# Patient Record
Sex: Male | Born: 1965 | Race: White | Hispanic: No | Marital: Married | Smoking: Never smoker
Health system: Southern US, Community
[De-identification: ages and names within clinical notes are randomized; demographics above are authoritative.]

## PROBLEM LIST (undated history)

## (undated) DIAGNOSIS — L649 Androgenic alopecia, unspecified: Secondary | ICD-10-CM

## (undated) DIAGNOSIS — E7800 Pure hypercholesterolemia, unspecified: Secondary | ICD-10-CM

## (undated) HISTORY — DX: Androgenic alopecia, unspecified: L64.9

---

## 1999-02-21 ENCOUNTER — Encounter: Payer: Self-pay | Admitting: Family Medicine

## 1999-02-21 ENCOUNTER — Encounter: Admission: RE | Admit: 1999-02-21 | Discharge: 1999-02-21 | Payer: Self-pay | Admitting: Family Medicine

## 2001-02-08 ENCOUNTER — Emergency Department (HOSPITAL_COMMUNITY): Admission: EM | Admit: 2001-02-08 | Discharge: 2001-02-08 | Payer: Self-pay | Admitting: *Deleted

## 2003-01-06 ENCOUNTER — Encounter: Admission: RE | Admit: 2003-01-06 | Discharge: 2003-01-21 | Payer: Self-pay | Admitting: *Deleted

## 2009-04-26 ENCOUNTER — Encounter: Admission: RE | Admit: 2009-04-26 | Discharge: 2009-05-20 | Payer: Self-pay | Admitting: Family Medicine

## 2010-07-17 ENCOUNTER — Emergency Department (HOSPITAL_BASED_OUTPATIENT_CLINIC_OR_DEPARTMENT_OTHER)
Admission: EM | Admit: 2010-07-17 | Discharge: 2010-07-17 | Disposition: A | Discharge status: Home / Self Care | Payer: 59 | Attending: Emergency Medicine | Admitting: Emergency Medicine

## 2010-07-17 ENCOUNTER — Encounter: Payer: Self-pay | Admitting: *Deleted

## 2010-07-17 DIAGNOSIS — T63481A Toxic effect of venom of other arthropod, accidental (unintentional), initial encounter: Secondary | ICD-10-CM

## 2010-07-17 DIAGNOSIS — T6391XA Toxic effect of contact with unspecified venomous animal, accidental (unintentional), initial encounter: Secondary | ICD-10-CM | POA: Insufficient documentation

## 2010-07-17 DIAGNOSIS — T63461A Toxic effect of venom of wasps, accidental (unintentional), initial encounter: Secondary | ICD-10-CM | POA: Insufficient documentation

## 2010-07-17 HISTORY — DX: Pure hypercholesterolemia, unspecified: E78.00

## 2010-07-17 NOTE — ED Provider Notes (Signed)
History     Chief Complaint  Patient presents with  . Insect Bite   The history is provided by the patient.  STUNG ON Saturday 2 DAYS AGO BY MOST LIKELY YELLOW JACKETS TO RIGHT ANKLE, KNEE AND WRIST. NOW WITH LOCALIZED SWELLING AND REDNESS. NO SOB NO HIVES NO RASH NO TONGUE OR LIP SWELLING. PAIN ABOUT 4/10/   Past Medical History  Diagnosis Date  . Hypercholesteremia     History reviewed. No pertinent past surgical history.  History reviewed. No pertinent family history.  History  Substance Use Topics  . Smoking status: Never Smoker   . Smokeless tobacco: Not on file  . Alcohol Use: No      Review of Systems  Constitutional: Negative for fever.  HENT: Negative for neck pain.   Eyes: Negative for redness and itching.  Respiratory: Negative for chest tightness, shortness of breath and wheezing.   Cardiovascular: Negative for chest pain and palpitations.  Gastrointestinal: Negative for nausea, vomiting and abdominal pain.  Genitourinary: Negative for hematuria.  Musculoskeletal: Negative for back pain.  Skin: Negative for rash.  Neurological: Negative for dizziness.  Hematological: Negative for adenopathy.    Physical Exam  BP 118/75  Pulse 78  Temp(Src) 97.4 F (36.3 C) (Oral)  Resp 18  SpO2 100%  Physical Exam  Constitutional: He is oriented to person, place, and time. He appears well-developed and well-nourished.  HENT:  Head: Normocephalic and atraumatic.  Mouth/Throat: Oropharynx is clear and moist.  Eyes: Conjunctivae and EOM are normal. Pupils are equal, round, and reactive to light.  Neck: Normal range of motion. Neck supple.  Cardiovascular: Normal rate, regular rhythm and intact distal pulses.   Murmur heard. Pulmonary/Chest: Effort normal and breath sounds normal. He has no wheezes.  Abdominal: Soft. Bowel sounds are normal.  Musculoskeletal: Normal range of motion. He exhibits tenderness. He exhibits no edema.       REDNESS AND SWELLING TO RIGHT  KNEE ABOUT 4 CM AND SMALL RED BUMPS TO RIGHT ANKLE AND SWELLING TO RIGHT WRIST. [place] DISTALLY INTACT.   Neurological: He is alert and oriented to person, place, and time. No cranial nerve deficit. He exhibits normal muscle tone.  Skin: Skin is warm and dry. No rash noted. There is erythema.    ED Course  Procedures  MDM CE LOCAL REACTION FROM BEE STING NO ALLERGIC REACTION AND NOT CW SECONDARY INFECTION.       Shelda Jakes, MD 07/17/10 [phone]

## 2010-07-17 NOTE — ED Notes (Signed)
Pt was bitten by bee on Saturday. Has sting to right knee, right ankle, right hand. Now c/o increased swelling, redness, and pain. No histopry of bee sting allergies

## 2010-11-07 ENCOUNTER — Encounter (INDEPENDENT_AMBULATORY_CARE_PROVIDER_SITE_OTHER): Payer: Self-pay | Admitting: Surgery

## 2010-11-15 ENCOUNTER — Ambulatory Visit (INDEPENDENT_AMBULATORY_CARE_PROVIDER_SITE_OTHER): Payer: Self-pay | Admitting: General Surgery

## 2010-11-16 ENCOUNTER — Ambulatory Visit (INDEPENDENT_AMBULATORY_CARE_PROVIDER_SITE_OTHER): Payer: 59 | Admitting: Surgery

## 2010-11-16 ENCOUNTER — Encounter (INDEPENDENT_AMBULATORY_CARE_PROVIDER_SITE_OTHER): Payer: Self-pay | Admitting: Surgery

## 2010-11-16 VITALS — BP 116/84 | HR 72 | Temp 98.0°F | Resp 12 | Ht 73.0 in | Wt 195.0 lb

## 2010-11-16 DIAGNOSIS — K645 Perianal venous thrombosis: Secondary | ICD-10-CM | POA: Insufficient documentation

## 2010-11-16 NOTE — Patient Instructions (Signed)
Stay well hydrated Use a daily fiber supplement Use a stool softener as needed for constipation

## 2010-11-16 NOTE — Progress Notes (Signed)
Chief Complaint  Patient presents with  . Other    new pt- eval hems    HPI Gerald Watkins is a 45 y.o. male.  Referred by Dr. Clarene Duke for evaluation of a thrombosed external hemorrhoid. The patient was recently on a trip where his diet changed and he became constipated. He developed a firm mass at the edge of his anus. He denies any pain or bleeding. He was seen by his primary care physician who started him on sitz baths and Anusol ointment. There has been some slight improvement. He continues to be pain-free. His bowel movements have returned to normal. No previous history of problems with hemorrhoids.  HPI  Past Medical History  Diagnosis Date  . Hypercholesteremia   . Hemorrhoids   . Male pattern baldness     History reviewed. No pertinent past surgical history.  Family History  Problem Relation Age of Onset  . Diabetes Mother   . Hypertension Mother   . Hyperlipidemia Father   . Heart disease Father   . Diabetes Sister     Social History History  Substance Use Topics  . Smoking status: Never Smoker   . Smokeless tobacco: Not on file  . Alcohol Use: Yes    No Known Allergies  Current Outpatient Prescriptions  Medication Sig Dispense Refill  . ezetimibe-simvastatin (VYTORIN) 10-40 MG per tablet Take 1 tablet by mouth at bedtime.        . finasteride (PROSCAR) 5 MG tablet Take 5 mg by mouth daily.          Review of Systems Review of Systems ROS negative Blood pressure 116/84, pulse 72, temperature 98 F (36.7 C), temperature source Temporal, resp. rate 12, height 6\' 1"  (1.854 m), weight 195 lb (88.451 kg).  Physical Exam Physical Exam WDWN in NAD HEENT:  EOMI, sclera anicteric Neck:  No masses, no thyromegaly Lungs:  CTA bilaterally; normal respiratory effort CV:  Regular rate and rhythm; no murmurs Abd:  +bowel sounds, soft, non-tender, no masses Rectal:  Small left thrombosed external hemorrhoid; no sign of internal hemorrhoid disease; no abscess of  fistula Ext:  Well-perfused; no edema Skin:  Warm, dry; no sign of jaundice  Data Reviewed .  Assessment    Small resolving thrombosed external hemorrhoid    Plan    Avoid constipation by using fiber supplements and PRN stool softeners.  No surgical treatment needed.       Sammuel Blick K. 11/16/2010, 10:12 AM

## 2011-12-02 ENCOUNTER — Encounter (HOSPITAL_COMMUNITY): Payer: Self-pay | Admitting: *Deleted

## 2011-12-02 ENCOUNTER — Emergency Department (HOSPITAL_COMMUNITY)
Admission: EM | Admit: 2011-12-02 | Discharge: 2011-12-02 | Disposition: A | Discharge status: Home / Self Care | Payer: 59 | Attending: Emergency Medicine | Admitting: Emergency Medicine

## 2011-12-02 DIAGNOSIS — W278XXA Contact with other nonpowered hand tool, initial encounter: Secondary | ICD-10-CM | POA: Insufficient documentation

## 2011-12-02 DIAGNOSIS — S61011A Laceration without foreign body of right thumb without damage to nail, initial encounter: Secondary | ICD-10-CM

## 2011-12-02 DIAGNOSIS — Y9389 Activity, other specified: Secondary | ICD-10-CM | POA: Insufficient documentation

## 2011-12-02 DIAGNOSIS — S61209A Unspecified open wound of unspecified finger without damage to nail, initial encounter: Secondary | ICD-10-CM | POA: Insufficient documentation

## 2011-12-02 DIAGNOSIS — Y929 Unspecified place or not applicable: Secondary | ICD-10-CM | POA: Insufficient documentation

## 2011-12-02 DIAGNOSIS — L659 Nonscarring hair loss, unspecified: Secondary | ICD-10-CM | POA: Insufficient documentation

## 2011-12-02 DIAGNOSIS — Z79899 Other long term (current) drug therapy: Secondary | ICD-10-CM | POA: Insufficient documentation

## 2011-12-02 DIAGNOSIS — Z8719 Personal history of other diseases of the digestive system: Secondary | ICD-10-CM | POA: Insufficient documentation

## 2011-12-02 DIAGNOSIS — E7800 Pure hypercholesterolemia, unspecified: Secondary | ICD-10-CM | POA: Insufficient documentation

## 2011-12-02 NOTE — Progress Notes (Signed)
Orthopedic Tech Progress Note Patient Details:  Gerald Watkins [birth year] [id number]  Ortho Devices Type of Ortho Device: Finger splint Ortho Device/Splint Location: (R) UE Ortho Device/Splint Interventions: Application   Jennye Moccasin 12/02/2011, 10:13 PM

## 2011-12-02 NOTE — ED Notes (Signed)
Ortho paged for finger splint   

## 2011-12-02 NOTE — ED Provider Notes (Signed)
Medical screening examination/treatment/procedure(s) were performed by non-physician practitioner and as supervising physician I was immediately available for consultation/collaboration.  John-Adam Gadiel John, M.D.     John-Adam Viviene Thurston, MD 12/02/11 2343 

## 2011-12-02 NOTE — ED Notes (Signed)
Reports cutting right thumb with chainsaw blade. appro 2 inch laceration noted, bleeding controlled.

## 2011-12-02 NOTE — ED Provider Notes (Signed)
History     CSN: [id number]  Arrival date & time 12/02/11  1755   First MD Initiated Contact with Patient 12/02/11 2006      Chief Complaint  Patient presents with  . Laceration    (Consider location/radiation/quality/duration/timing/severity/associated sxs/prior treatment) HPI History provided by pt.   Pt was cutting down a christmas tree with a chain saw this afternoon and accidentally touched the palmar surface of his right thumb to the blade. Bleeding controlled and pain 1/10.  No associated paresthesias.  Last tetanus 1 month ago.  Past Medical History  Diagnosis Date  . Hypercholesteremia   . Hemorrhoids   . Male pattern baldness     History reviewed. No pertinent past surgical history.  Family History  Problem Relation Age of Onset  . Diabetes Mother   . Hypertension Mother   . Hyperlipidemia Father   . Heart disease Father   . Diabetes Sister     History  Substance Use Topics  . Smoking status: Never Smoker   . Smokeless tobacco: Not on file  . Alcohol Use: Yes      Review of Systems  All other systems reviewed and are negative.    Allergies  Thimerosal  Home Medications   Current Outpatient Rx  Name  Route  Sig  Dispense  Refill  . EZETIMIBE-SIMVASTATIN 10-40 MG PO TABS   Oral   Take 1 tablet by mouth at bedtime.           Marland Kitchen FINASTERIDE 5 MG PO TABS   Oral   Take 1 mg by mouth daily.           BP 144/90  Pulse 65  Temp 98.2 F (36.8 C) (Oral)  Resp 20  SpO2 98%  Physical Exam  Nursing note and vitals reviewed. Constitutional: He is oriented to person, place, and time. He appears well-developed and well-nourished. No distress.  HENT:  Head: Normocephalic and atraumatic.  Eyes:       Normal appearance  Neck: Normal range of motion.  Pulmonary/Chest: Effort normal.  Musculoskeletal: Normal range of motion.  Neurological: He is alert and oriented to person, place, and time.  Skin:       2cm subq hemostatic lac that runs  vertically down palmar surface of proximal phalanx.  Minimally tender.  No pain w/ active flexion of proximal and distal joints. Distal sensation intact.    Psychiatric: He has a normal mood and affect. His behavior is normal.    ED Course  Procedures (including critical care time)  LACERATION REPAIR Performed by: Otilio Miu Authorized by: Ruby Cola E Consent: Verbal consent obtained. Risks and benefits: risks, benefits and alternatives were discussed Consent given by: patient Patient identity confirmed: provided demographic data Prepped and Draped in normal sterile fashion Wound explored  Laceration Location: R thumb   Laceration Length: 2.0cm  No Foreign Bodies seen or palpated  Anesthesia: local infiltration  Digital block and Local anesthetic: lidocaine 2% w/out epinephrine  Anesthetic total: 8 ml total  Irrigation method: Nursing staff performed   Skin closure: prolen 4.0  Number of sutures: 5  Technique: simple interrupted  Patient tolerance: Patient tolerated the procedure well with no immediate complications.  Labs Reviewed - No data to display No results found.   1. Laceration of right thumb       MDM  46yo M presents w/ lac to right thumb.  Wound cleaned by nursing staff and sutured by myself.  Ortho tech placed in  finger splint. Tetanus is up to date.  D/c' d home w/ return precautions.         Otilio Miu, PA-C 12/02/11 2150

## 2013-05-18 ENCOUNTER — Encounter (HOSPITAL_BASED_OUTPATIENT_CLINIC_OR_DEPARTMENT_OTHER): Payer: Self-pay | Admitting: Emergency Medicine

## 2013-05-18 ENCOUNTER — Emergency Department (HOSPITAL_BASED_OUTPATIENT_CLINIC_OR_DEPARTMENT_OTHER): Payer: Managed Care, Other (non HMO)

## 2013-05-18 ENCOUNTER — Emergency Department (HOSPITAL_BASED_OUTPATIENT_CLINIC_OR_DEPARTMENT_OTHER)
Admission: EM | Admit: 2013-05-18 | Discharge: 2013-05-18 | Disposition: A | Discharge status: Home / Self Care | Payer: Managed Care, Other (non HMO) | Attending: Emergency Medicine | Admitting: Emergency Medicine

## 2013-05-18 DIAGNOSIS — Y92009 Unspecified place in unspecified non-institutional (private) residence as the place of occurrence of the external cause: Secondary | ICD-10-CM | POA: Insufficient documentation

## 2013-05-18 DIAGNOSIS — Y9389 Activity, other specified: Secondary | ICD-10-CM | POA: Insufficient documentation

## 2013-05-18 DIAGNOSIS — Z872 Personal history of diseases of the skin and subcutaneous tissue: Secondary | ICD-10-CM | POA: Insufficient documentation

## 2013-05-18 DIAGNOSIS — R296 Repeated falls: Secondary | ICD-10-CM | POA: Insufficient documentation

## 2013-05-18 DIAGNOSIS — X500XXA Overexertion from strenuous movement or load, initial encounter: Secondary | ICD-10-CM | POA: Insufficient documentation

## 2013-05-18 DIAGNOSIS — E7800 Pure hypercholesterolemia, unspecified: Secondary | ICD-10-CM | POA: Insufficient documentation

## 2013-05-18 DIAGNOSIS — Z91040 Latex allergy status: Secondary | ICD-10-CM | POA: Insufficient documentation

## 2013-05-18 DIAGNOSIS — S93401A Sprain of unspecified ligament of right ankle, initial encounter: Secondary | ICD-10-CM

## 2013-05-18 DIAGNOSIS — Z8679 Personal history of other diseases of the circulatory system: Secondary | ICD-10-CM | POA: Insufficient documentation

## 2013-05-18 DIAGNOSIS — S93409A Sprain of unspecified ligament of unspecified ankle, initial encounter: Secondary | ICD-10-CM | POA: Insufficient documentation

## 2013-05-18 DIAGNOSIS — Z79899 Other long term (current) drug therapy: Secondary | ICD-10-CM | POA: Insufficient documentation

## 2013-05-18 NOTE — ED Notes (Signed)
Pt returned from xr

## 2013-05-18 NOTE — ED Notes (Signed)
Pt c/o having rolled his right ankle yesterday. Same is now swollen and painful. Pt sts he felt pop.

## 2013-05-18 NOTE — ED Notes (Signed)
Patient transported to X-ray 

## 2013-05-18 NOTE — Discharge Instructions (Signed)

## 2013-05-18 NOTE — ED Provider Notes (Signed)
CSN: [id number]     Arrival date & time 05/18/13  0704 History   First MD Initiated Contact with Patient 05/18/13 [phone]     Chief Complaint  Patient presents with  . Ankle Pain     (Consider location/radiation/quality/duration/timing/severity/associated sxs/prior Treatment) Patient is a 48 y.o. male presenting with ankle pain.  Ankle Pain  Pt reports he stumbled and fell in a hole in his yard yesterday twisting his R ankle. He heard a pop, complaining of moderate aching pain to lateral ankle, worse with movement, but able to bear weight with some discomfort. Denies any other injuries,.   Past Medical History  Diagnosis Date  . Hypercholesteremia   . Hemorrhoids   . Male pattern baldness    History reviewed. No pertinent past surgical history. Family History  Problem Relation Age of Onset  . Diabetes Mother   . Hypertension Mother   . Hyperlipidemia Father   . Heart disease Father   . Diabetes Sister    History  Substance Use Topics  . Smoking status: Never Smoker   . Smokeless tobacco: Not on file  . Alcohol Use: Yes    Review of Systems All other systems reviewed and are negative except as noted in HPI.     Allergies  Thimerosal and Latex  Home Medications   Prior to Admission medications   Medication Sig Start Date End Date Taking? Authorizing Provider  atorvastatin (LIPITOR) 10 MG tablet Take 10 mg by mouth daily.   Yes Historical Provider, MD  ezetimibe-simvastatin (VYTORIN) 10-40 MG per tablet Take 1 tablet by mouth at bedtime.      Historical Provider, MD  finasteride (PROSCAR) 5 MG tablet Take 1 mg by mouth daily.    Historical Provider, MD   BP 122/76  Pulse 74  Temp(Src) 98.1 F (36.7 C) (Oral)  Resp 16  SpO2 100% Physical Exam  Constitutional: He is oriented to person, place, and time. He appears well-developed and well-nourished.  HENT:  Head: Normocephalic and atraumatic.  Eyes: EOM are normal.  Neck: Neck supple.  Pulmonary/Chest: Effort  normal.  Musculoskeletal: He exhibits tenderness (R lateral maleolus with moderate swelling, no deformity, NVI).  Neurological: He is alert and oriented to person, place, and time. No cranial nerve deficit.  Psychiatric: He has a normal mood and affect. His behavior is normal.    ED Course  Procedures (including critical care time) Labs Review Labs Reviewed - No data to display  Imaging Review Dg Ankle Complete Right  05/18/2013   CLINICAL DATA:  Pain and swelling post trauma  EXAM: RIGHT ANKLE - COMPLETE 3+ VIEW  COMPARISON:  None.  FINDINGS: Frontal, oblique, and lateral views were obtained. There is no fracture or appreciable effusion. The ankle mortise appears intact. There is mild narrowing medially. No erosive change.  IMPRESSION: Osteoarthritic change medially. No apparent fracture. Ankle mortise appears grossly intact.   Electronically Signed   By: Bretta BangWilliam  Woodruff M.D.   On: 05/18/2013 07:32     EKG Interpretation None      MDM   Final diagnoses:  Sprain of ankle, right    ASO, RICE, Motrin/APAP for pain.     Charles B. Bernette MayersSheldon, MD 05/18/13 [phone]

## 2014-05-10 ENCOUNTER — Other Ambulatory Visit: Payer: Self-pay | Admitting: Cardiology

## 2014-05-10 DIAGNOSIS — E785 Hyperlipidemia, unspecified: Secondary | ICD-10-CM

## 2014-05-12 ENCOUNTER — Ambulatory Visit
Admission: RE | Admit: 2014-05-12 | Discharge: 2014-05-12 | Disposition: A | Discharge status: Home / Self Care | Payer: No Typology Code available for payment source | Source: Ambulatory Visit | Attending: Cardiology | Admitting: Cardiology

## 2014-05-12 DIAGNOSIS — E785 Hyperlipidemia, unspecified: Secondary | ICD-10-CM

## 2014-05-14 ENCOUNTER — Other Ambulatory Visit: Payer: Managed Care, Other (non HMO)

## 2018-12-23 ENCOUNTER — Other Ambulatory Visit: Payer: Self-pay

## 2018-12-23 ENCOUNTER — Ambulatory Visit: Payer: Managed Care, Other (non HMO) | Attending: Family Medicine

## 2018-12-23 DIAGNOSIS — Z20822 Contact with and (suspected) exposure to covid-19: Secondary | ICD-10-CM

## 2018-12-23 DIAGNOSIS — Z20828 Contact with and (suspected) exposure to other viral communicable diseases: Secondary | ICD-10-CM | POA: Insufficient documentation

## 2018-12-24 LAB — NOVEL CORONAVIRUS, NAA: SARS-CoV-2, NAA: NOT DETECTED

## 2020-11-28 ENCOUNTER — Other Ambulatory Visit: Payer: Self-pay

## 2020-11-28 ENCOUNTER — Emergency Department (HOSPITAL_BASED_OUTPATIENT_CLINIC_OR_DEPARTMENT_OTHER)
Admission: EM | Admit: 2020-11-28 | Discharge: 2020-11-28 | Disposition: A | Discharge status: Home / Self Care | Payer: Managed Care, Other (non HMO) | Attending: Emergency Medicine | Admitting: Emergency Medicine

## 2020-11-28 ENCOUNTER — Emergency Department (HOSPITAL_BASED_OUTPATIENT_CLINIC_OR_DEPARTMENT_OTHER): Payer: Managed Care, Other (non HMO)

## 2020-11-28 ENCOUNTER — Encounter (HOSPITAL_BASED_OUTPATIENT_CLINIC_OR_DEPARTMENT_OTHER): Payer: Self-pay | Admitting: Emergency Medicine

## 2020-11-28 DIAGNOSIS — R112 Nausea with vomiting, unspecified: Secondary | ICD-10-CM | POA: Diagnosis not present

## 2020-11-28 DIAGNOSIS — R070 Pain in throat: Secondary | ICD-10-CM | POA: Diagnosis present

## 2020-11-28 DIAGNOSIS — J029 Acute pharyngitis, unspecified: Secondary | ICD-10-CM | POA: Insufficient documentation

## 2020-11-28 DIAGNOSIS — R109 Unspecified abdominal pain: Secondary | ICD-10-CM | POA: Insufficient documentation

## 2020-11-28 DIAGNOSIS — Z91040 Latex allergy status: Secondary | ICD-10-CM | POA: Diagnosis not present

## 2020-11-28 DIAGNOSIS — M542 Cervicalgia: Secondary | ICD-10-CM | POA: Insufficient documentation

## 2020-11-28 LAB — CBC
HCT: 35.9 % — ABNORMAL LOW (ref 39.0–52.0)
Hemoglobin: 12.7 g/dL — ABNORMAL LOW (ref 13.0–17.0)
MCH: 31.8 pg (ref 26.0–34.0)
MCHC: 35.4 g/dL (ref 30.0–36.0)
MCV: 90 fL (ref 80.0–100.0)
Platelets: 282 10*3/uL (ref 150–400)
RBC: 3.99 MIL/uL — ABNORMAL LOW (ref 4.22–5.81)
RDW: 12.1 % (ref 11.5–15.5)
WBC: 7.7 10*3/uL (ref 4.0–10.5)
nRBC: 0 % (ref 0.0–0.2)

## 2020-11-28 LAB — BASIC METABOLIC PANEL
Anion gap: 8 (ref 5–15)
BUN: 13 mg/dL (ref 6–20)
CO2: 23 mmol/L (ref 22–32)
Calcium: 8.4 mg/dL — ABNORMAL LOW (ref 8.9–10.3)
Chloride: 105 mmol/L (ref 98–111)
Creatinine, Ser: 0.85 mg/dL (ref 0.61–1.24)
GFR, Estimated: >60 mL/min (ref >60)
Glucose, Bld: 97 mg/dL (ref 70–99)
Potassium: 3.7 mmol/L (ref 3.5–5.1)
Sodium: 136 mmol/L (ref 135–145)

## 2020-11-28 LAB — TROPONIN I (HIGH SENSITIVITY): Troponin I (High Sensitivity): 2 ng/L (ref <18)

## 2020-11-28 MED ORDER — LACTATED RINGERS IV BOLUS
1000.0000 mL | Freq: Once | INTRAVENOUS | Status: AC
  Administered 2020-11-28: 09:00:00 1000 mL via INTRAVENOUS

## 2020-11-28 MED ORDER — IOHEXOL 300 MG/ML  SOLN
100.0000 mL | Freq: Once | INTRAMUSCULAR | Status: AC | PRN
  Administered 2020-11-28: 09:00:00 75 mL via INTRAVENOUS

## 2020-11-28 MED ORDER — KETOROLAC TROMETHAMINE 15 MG/ML IJ SOLN
15.0000 mg | Freq: Once | INTRAMUSCULAR | Status: AC
  Administered 2020-11-28: 11:00:00 15 mg via INTRAVENOUS
  Filled 2020-11-28 (×2): qty 1

## 2020-11-28 NOTE — ED Notes (Signed)
Pt transported to CT ?

## 2020-11-28 NOTE — ED Triage Notes (Signed)
Pt reports he had abd pain last night and began having emesis. After throwing up pt complains of chest and throat pain. Says he can swallow saliva but that is it.

## 2020-11-28 NOTE — ED Notes (Signed)
Pt does not want toradol at present time

## 2020-11-28 NOTE — ED Provider Notes (Signed)
[facility] EMERGENCY DEPARTMENT Provider Note   CSN: [id number] Arrival date & time: 11/28/20  0741     History Chief Complaint  Patient presents with   Chest Pain   Sore Throat    ZAKARIYA OHERRON is a 55 y.o. male.   Chest Pain Associated symptoms: dysphagia, nausea and vomiting   Associated symptoms: no abdominal pain, no back pain, no cough, no diaphoresis, no dizziness, no fatigue, no fever, no headache, no numbness, no palpitations, no shortness of breath and no weakness   Sore Throat Associated symptoms include chest pain. Pertinent negatives include no abdominal pain, no headaches and no shortness of breath.  Patient presents for sore throat and globus sensation.  Yesterday, going to bed, patient was in his normal state of health.  At approximately 1:30 AM, he woke up with severe epigastric pain.  He had multiple episodes of what he describes as violent vomiting.  Since that time, he has had globus sensation in the midportion of his neck, pain, and odynophagia.  Odynophagia so severe that patient has been unable to swallow.  He has been spitting even his saliva secretions.  He denies any history of the same.  Currently, pain is localized to the midportion of his neck.  He denies any chest pain, pleurisy, or shortness of breath.  He is in good health and has no known chronic medical conditions.  He is quite active.  Currently, he denies any nausea or residual epigastric pain.  The episode of abdominal pain and vomiting is a rare thing for him.  He typically does not experience symptoms like this.  He is unaware of any suspicious food that he ate yesterday.      Past Medical History:  Diagnosis Date   Hemorrhoids    Hypercholesteremia    Male pattern baldness     Patient Active Problem List   Diagnosis Date Noted   Thrombosed external hemorrhoid 11/16/2010    History reviewed. No pertinent surgical history.     Family History  Problem Relation Age of Onset    Diabetes Mother    Hypertension Mother    Hyperlipidemia Father    Heart disease Father    Diabetes Sister     Social History   Tobacco Use   Smoking status: Never  Substance Use Topics   Alcohol use: Yes   Drug use: No    Home Medications Prior to Admission medications   Medication Sig Start Date End Date Taking? Authorizing Provider  atorvastatin (LIPITOR) 10 MG tablet Take 10 mg by mouth daily.    [provider]  ezetimibe-simvastatin (VYTORIN) 10-40 MG per tablet Take 1 tablet by mouth at bedtime.      [provider]  finasteride (PROSCAR) 5 MG tablet Take 1 mg by mouth daily.    [provider]    Allergies    Thimerosal and Latex  Review of Systems   Review of Systems  Constitutional:  Negative for chills, diaphoresis, fatigue and fever.  HENT:  Positive for sore throat and trouble swallowing. Negative for congestion, ear pain and rhinorrhea.   Eyes:  Negative for pain and visual disturbance.  Respiratory:  Negative for cough, chest tightness and shortness of breath.   Cardiovascular:  Positive for chest pain. Negative for palpitations and leg swelling.  Gastrointestinal:  Positive for nausea and vomiting. Negative for abdominal distention, abdominal pain and diarrhea.  Genitourinary:  Negative for dysuria, flank pain and hematuria.  Musculoskeletal:  Negative for  arthralgias, back pain, gait problem, joint swelling, myalgias, neck pain and neck stiffness.  Skin:  Negative for color change and rash.  Neurological:  Negative for dizziness, seizures, syncope, weakness, light-headedness, numbness and headaches.  Psychiatric/Behavioral:  Negative for confusion and decreased concentration.   All other systems reviewed and are negative.  Physical Exam Updated Vital Signs BP (!) 132/112   Pulse 68   Temp 97.8 F (36.6 C) (Oral)   Resp 16   Ht 6\' 1"  (1.854 m)   Wt 92.5 kg   SpO2 100%   BMI 26.91 kg/m   Physical Exam Vitals and nursing  note reviewed.  Constitutional:      General: He is not in acute distress.    Appearance: He is well-developed and normal weight. He is not ill-appearing, toxic-appearing or diaphoretic.  HENT:     Head: Normocephalic and atraumatic.     Nose: No congestion or rhinorrhea.     Mouth/Throat:     Mouth: Mucous membranes are moist. No oral lesions.     Pharynx: Oropharynx is clear. Uvula midline. Uvula swelling present. No oropharyngeal exudate or posterior oropharyngeal erythema.  Eyes:     Extraocular Movements: Extraocular movements intact.     Conjunctiva/sclera: Conjunctivae normal.  Neck:     Vascular: No JVD.  Cardiovascular:     Rate and Rhythm: Normal rate and regular rhythm.     Heart sounds: No murmur heard. Pulmonary:     Effort: Pulmonary effort is normal. No tachypnea or respiratory distress.     Breath sounds: Normal breath sounds. No decreased breath sounds, wheezing, rhonchi or rales.  Chest:     Chest wall: No mass or tenderness.  Abdominal:     Palpations: Abdomen is soft.     Tenderness: There is no abdominal tenderness.  Musculoskeletal:        General: No swelling.     Cervical back: Normal range of motion and neck supple.     Right lower leg: No edema.     Left lower leg: No edema.  Skin:    General: Skin is warm and dry.     Capillary Refill: Capillary refill takes less than 2 seconds.  Neurological:     General: No focal deficit present.     Mental Status: He is alert and oriented to person, place, and time.     Cranial Nerves: No cranial nerve deficit.     Motor: No weakness.  Psychiatric:        Mood and Affect: Mood normal.        Behavior: Behavior normal.    ED Results / Procedures / Treatments   Labs (all labs ordered are listed, but only abnormal results are displayed) Labs Reviewed  BASIC METABOLIC PANEL - Abnormal; Notable for the following components:      Result Value   Calcium 8.4 (*)    All other components within normal limits   CBC - Abnormal; Notable for the following components:   RBC 3.99 (*)    Hemoglobin 12.7 (*)    HCT 35.9 (*)    All other components within normal limits  TROPONIN I (HIGH SENSITIVITY)    EKG EKG Interpretation  Date/Time:  Monday November 28 2020 07:52:50 EST Ventricular Rate:  78 PR Interval:  159 QRS Duration: 98 QT Interval:  390 QTC Calculation: 445 R Axis:   73 Text Interpretation: Sinus rhythm Confirmed by Godfrey Pick ([id number]) on 11/28/2020 7:57:15 AM  Radiology DG Chest 2 View  Result Date: 11/28/2020 CLINICAL DATA:  Chest and abdominal pain Emesis EXAM: CHEST - 2 VIEW COMPARISON:  None. FINDINGS: The heart size and mediastinal contours are within normal limits. Both lungs are clear. The visualized skeletal structures are unremarkable. IMPRESSION: No active cardiopulmonary disease. Electronically Signed   By: Acquanetta Belling M.D.   On: 11/28/2020 08:09   CT Soft Tissue Neck W Contrast  Result Date: 11/28/2020 CLINICAL DATA:  Esophageal mass; technologist note states pain after throwing up EXAM: CT NECK WITH CONTRAST TECHNIQUE: Multidetector CT imaging of the neck was performed using the standard protocol following the bolus administration of intravenous contrast. CONTRAST:  38mL OMNIPAQUE IOHEXOL 300 MG/ML  SOLN COMPARISON:  None. FINDINGS: Pharynx and larynx: Unremarkable. No mass or swelling. Vocal cords opposed at the time of imaging. Airway is patent. Salivary glands: Parotid and submandibular glands are unremarkable. Thyroid: Normal. Lymph nodes: No enlarged or abnormal density nodes. Vascular: Major neck vessels are patent. Limited intracranial: No abnormal enhancement. Visualized orbits: Unremarkable. Mastoids and visualized paranasal sinuses: Paranasal sinus mucosal thickening. Mastoid air cells are clear. Skeleton: Minor degenerative changes at C5-C6. Upper chest: No apical lung mass. Other: None. IMPRESSION: No acute or significant abnormality. Electronically Signed   By:  Guadlupe Spanish M.D.   On: 11/28/2020 08:50    Procedures Procedures   Medications Ordered in ED Medications  lactated ringers bolus 1,000 mL (0 mLs Intravenous Stopped 11/28/20 1044)  ketorolac (TORADOL) 15 MG/ML injection 15 mg (15 mg Intravenous Given 11/28/20 1044)  iohexol (OMNIPAQUE) 300 MG/ML solution 100 mL (75 mLs Intravenous Contrast Given 11/28/20 0831)    ED Course  I have reviewed the triage vital signs and the nursing notes.  Pertinent labs & imaging results that were available during my care of the patient were reviewed by me and considered in my medical decision making (see chart for details).    MDM Rules/Calculators/A&P                          Patient presents for globus sensation and odynophagia following an episode of violent vomiting last night.  On arrival, vital signs are normal.  He is well-appearing.  EKG shows no sign of ischemia.  Chest x-ray was obtained which did not show any clear evidence of pneumomediastinum.  Patient undergo lab work and CT scan of neck.  Given his p.o. intolerance and vomiting, IV fluids were ordered.  Toradol was ordered for analgesia.  Patient initially declined the Toradol.  CT scan showed no acute findings.  He was informed of these results.  At this time, he is able to tolerate swallowing.  He was given ice cream, which he was able to tolerate.  He was agreeable to dose of Toradol for analgesia.  Following this, he had further improvement in his symptoms.  He is stable for discharge at this time.  He was advised to return to the ED if he does have any worsening of his symptoms.  Additionally, contact information was given for ENT follow-up, as needed.  Patient was discharged in good condition.  Final Clinical Impression(s) / ED Diagnoses Final diagnoses:  Sore throat    Rx / DC Orders ED Discharge Orders     None        Gloris Manchester, MD 11/29/20 [phone]

## 2021-10-06 ENCOUNTER — Other Ambulatory Visit: Payer: Self-pay | Admitting: Family Medicine

## 2021-10-06 DIAGNOSIS — R7989 Other specified abnormal findings of blood chemistry: Secondary | ICD-10-CM

## 2021-10-11 ENCOUNTER — Other Ambulatory Visit: Payer: Self-pay | Admitting: Family Medicine

## 2021-10-11 DIAGNOSIS — E7800 Pure hypercholesterolemia, unspecified: Secondary | ICD-10-CM

## 2021-10-16 ENCOUNTER — Other Ambulatory Visit: Payer: No Typology Code available for payment source

## 2021-10-17 ENCOUNTER — Ambulatory Visit
Admission: RE | Admit: 2021-10-17 | Discharge: 2021-10-17 | Disposition: A | Discharge status: Home / Self Care | Payer: Managed Care, Other (non HMO) | Source: Ambulatory Visit | Attending: Family Medicine | Admitting: Family Medicine

## 2021-10-17 DIAGNOSIS — R7989 Other specified abnormal findings of blood chemistry: Secondary | ICD-10-CM

## 2021-10-23 ENCOUNTER — Other Ambulatory Visit: Payer: No Typology Code available for payment source

## 2021-10-31 ENCOUNTER — Ambulatory Visit
Admission: RE | Admit: 2021-10-31 | Discharge: 2021-10-31 | Disposition: A | Discharge status: Home / Self Care | Payer: No Typology Code available for payment source | Source: Ambulatory Visit | Attending: Family Medicine | Admitting: Family Medicine

## 2021-10-31 DIAGNOSIS — E7800 Pure hypercholesterolemia, unspecified: Secondary | ICD-10-CM

## 2022-11-10 IMAGING — CR DG CHEST 2V
2 series · 2 of 2 positions shown · non-contrast
Comparison: None.

CLINICAL DATA: Chest and abdominal pain

Emesis
EXAM:
CHEST - 2 VIEW

[w chest pa]
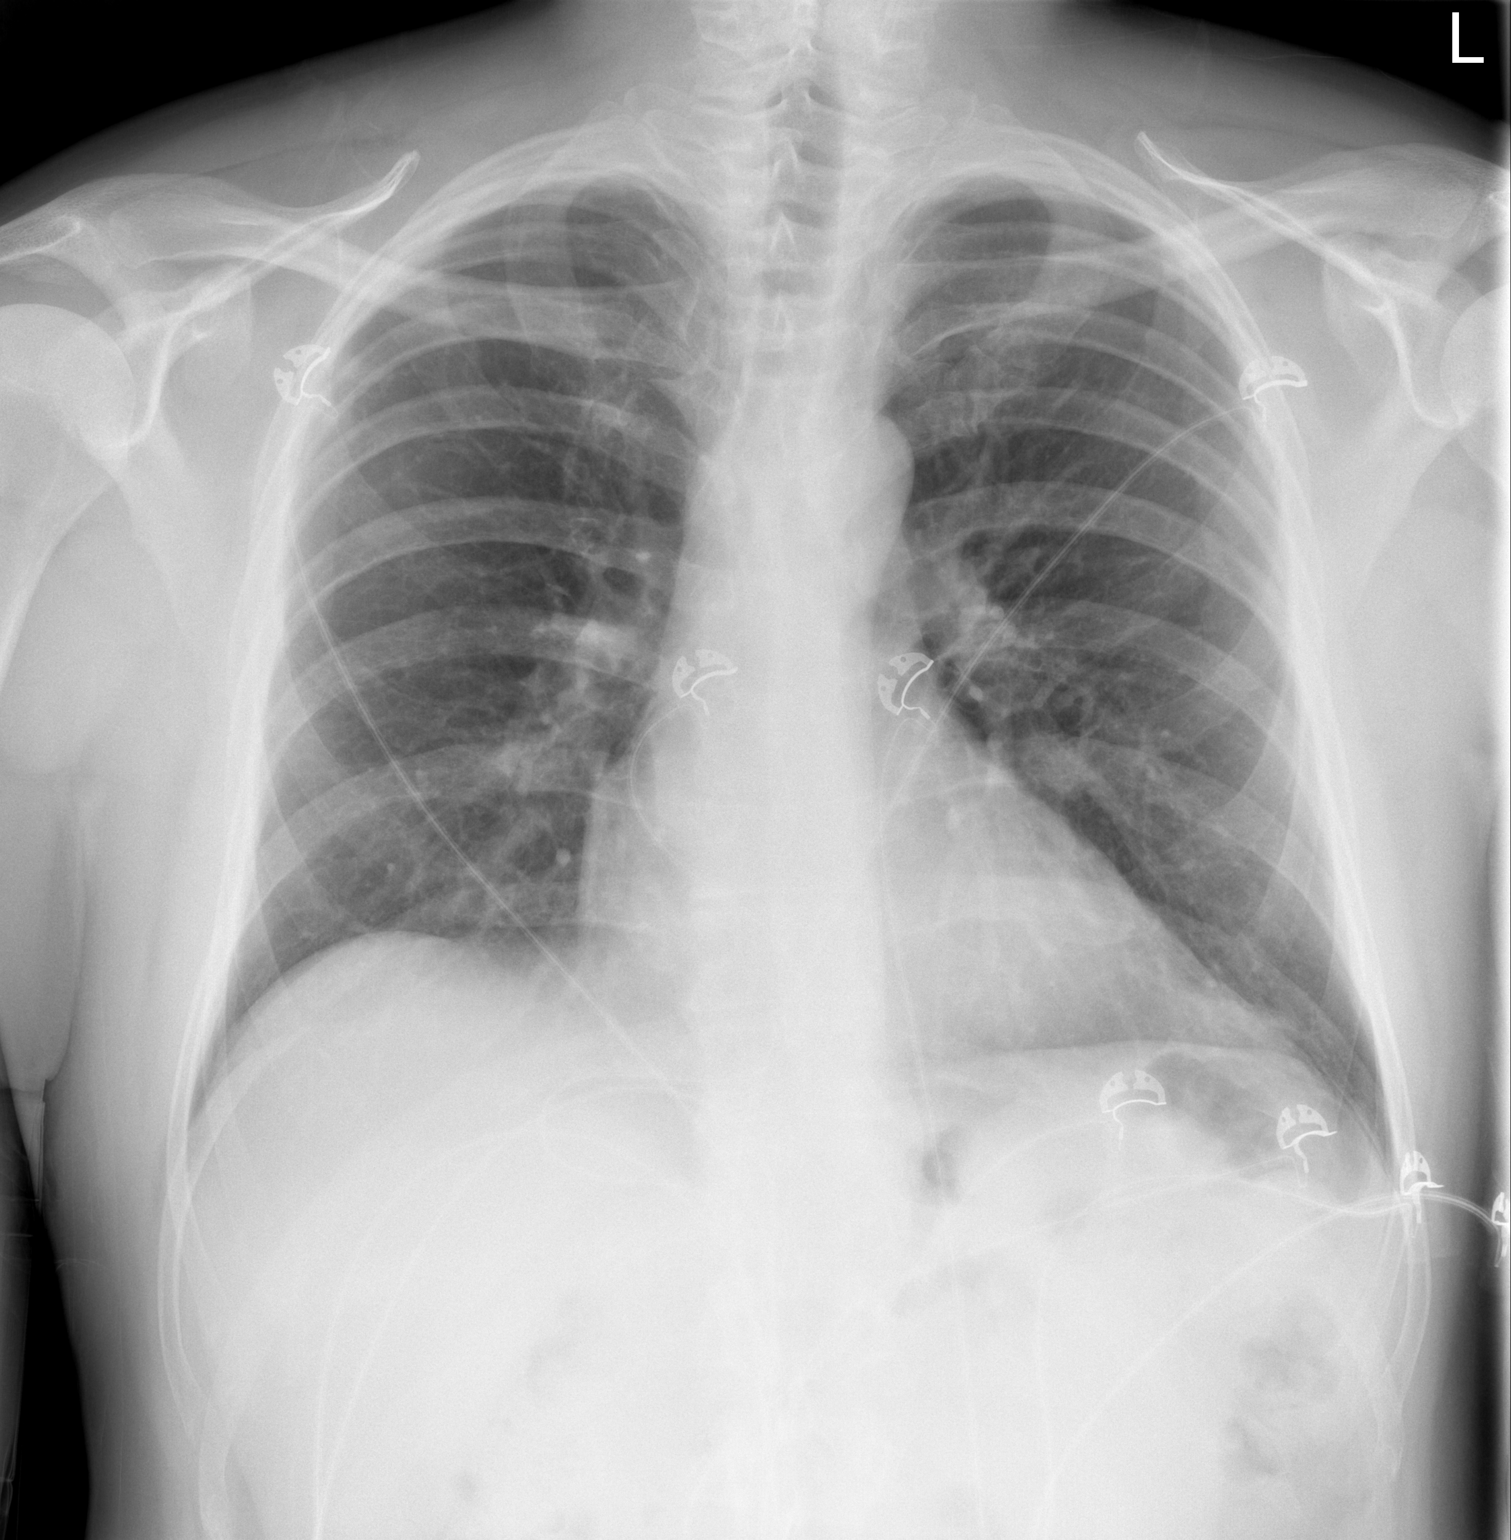

[w chest lat]
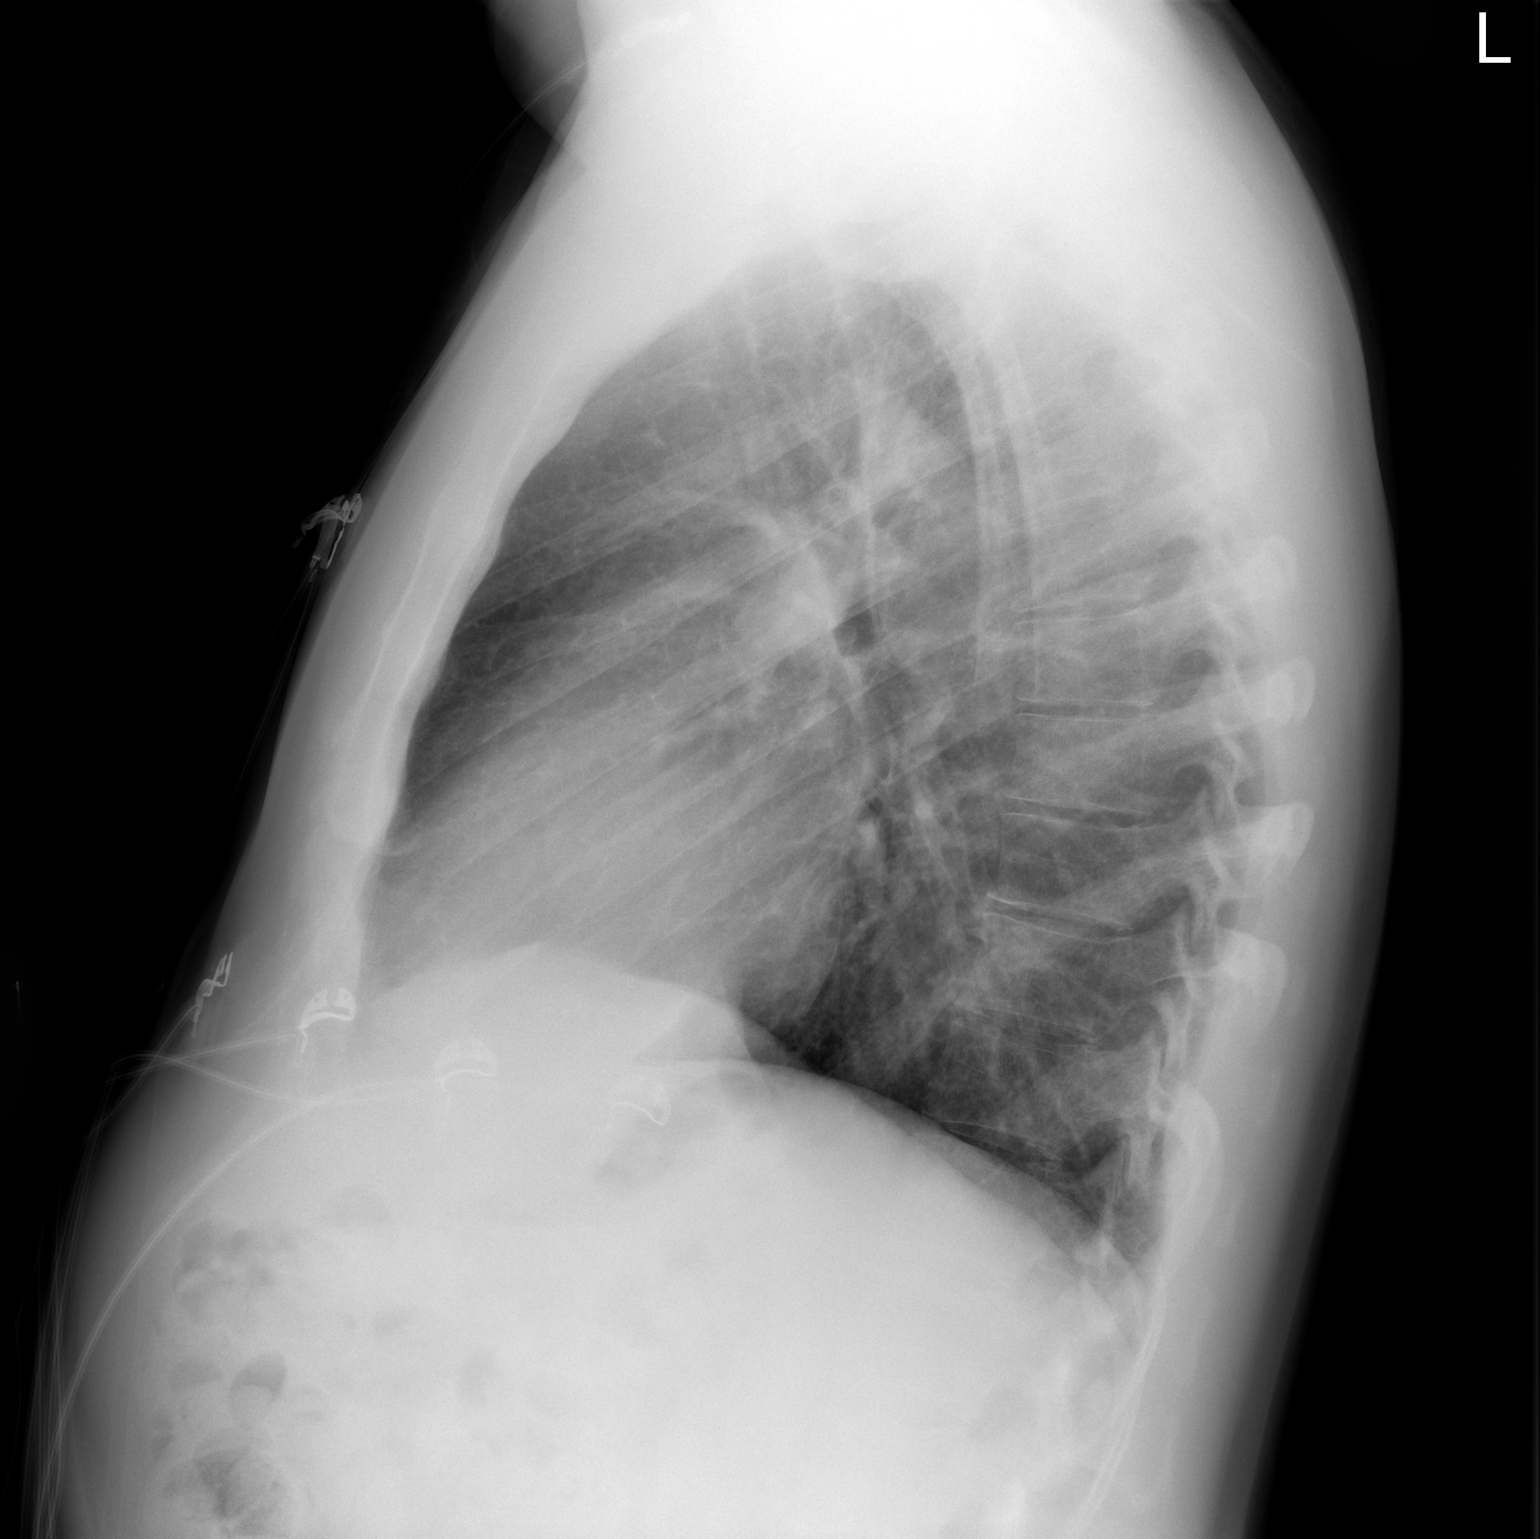

[2 of 2 positions shown; findings below may reference images not displayed]

FINDINGS: The heart size and mediastinal contours are within normal limits.
Both lungs are clear. The visualized skeletal structures are
unremarkable.
IMPRESSION: No active cardiopulmonary disease.

## 2022-11-10 IMAGING — CT CT NECK W/ CM
4 series · 15 of 33 positions shown, 18 images · IV contrast (omnipaque)
Comparison: None.

CLINICAL DATA: Esophageal mass; technologist note states pain after
throwing up

EXAM:
CT NECK WITH CONTRAST
TECHNIQUE: Multidetector CT imaging of the neck was performed using the
standard protocol following the bolus administration of intravenous
contrast.
CONTRAST:  75mL OMNIPAQUE IOHEXOL 300 MG/ML  SOLN

[Series 3: axial neck · axial · 0.57mm/px · z∈[-286,-102]mm · 5 of 140 slices shown, 7 images]
[im 24/140  soft-tissue]
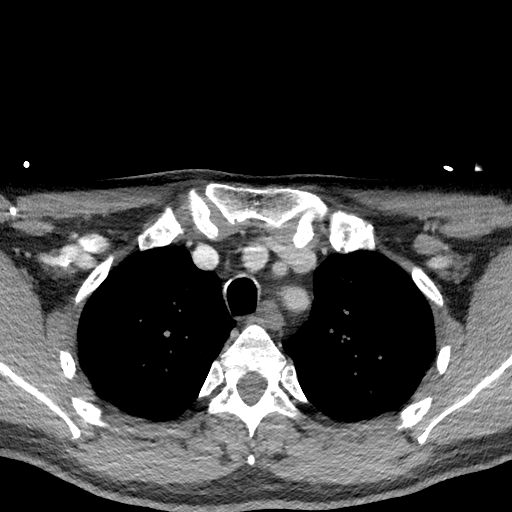
[im 24/140  bone]
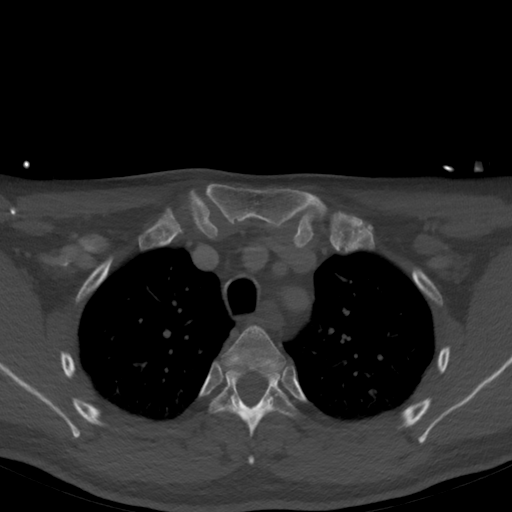
[im 47/140  bone]
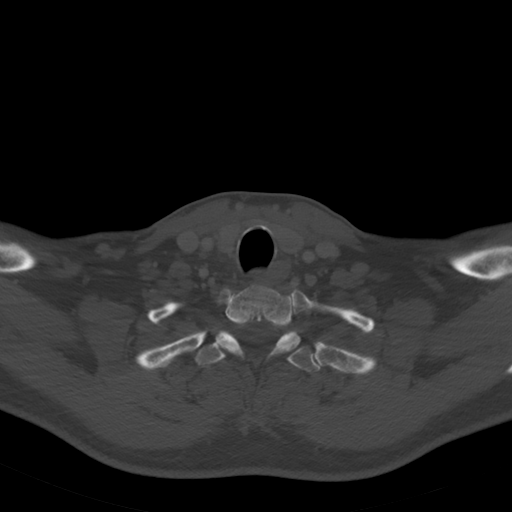
[im 70/140  bone]
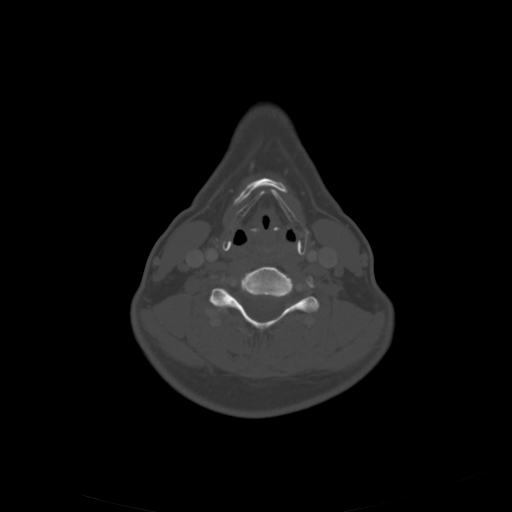
[im 93/140  bone]
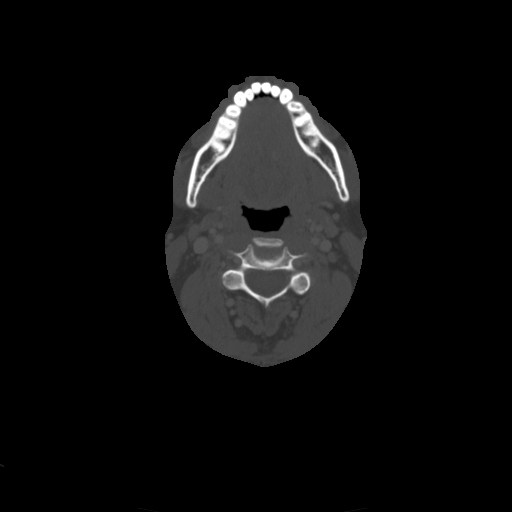
[im 116/140  soft-tissue]
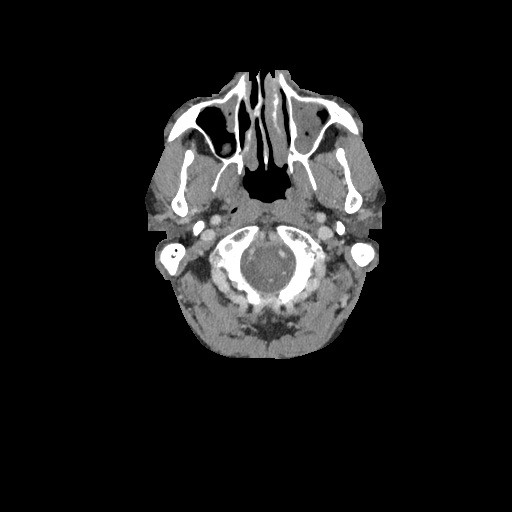
[im 116/140  bone]
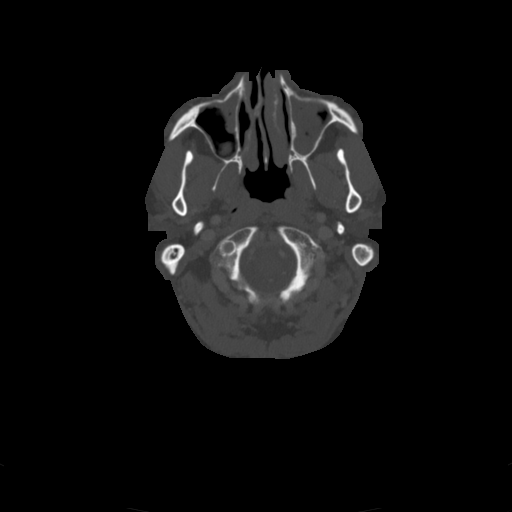

[Series 6: sag neck · sagittal · 0.55mm/px · 5 of 91 slices shown, 6 images]
[im 31/91  bone]
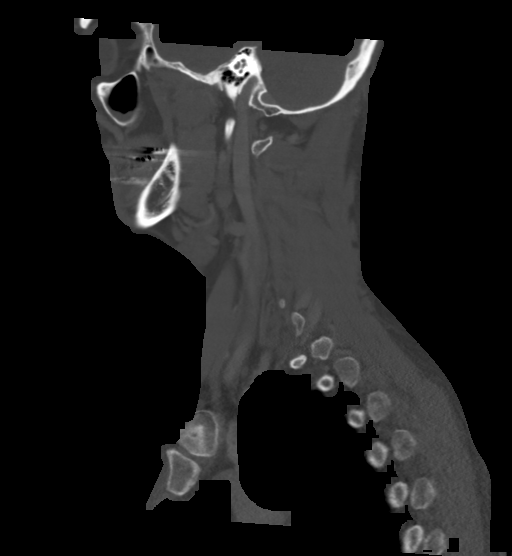
[im 38/91  bone]
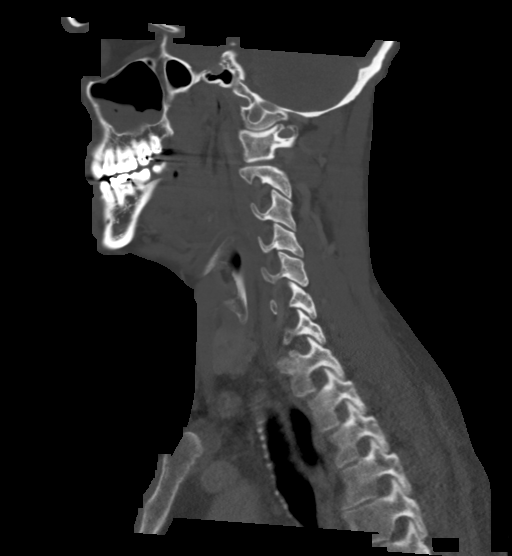
[im 46/91  soft-tissue]
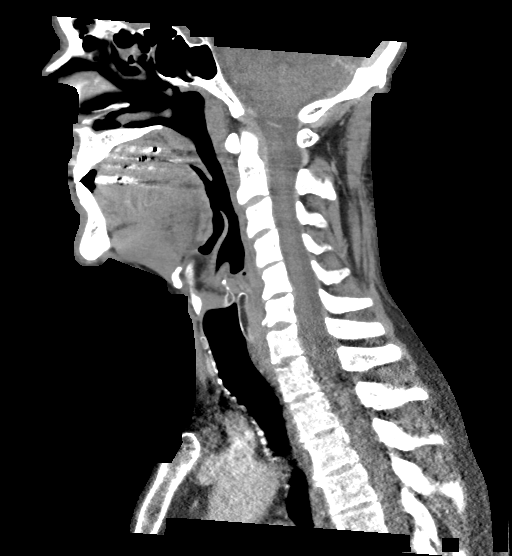
[im 46/91  bone]
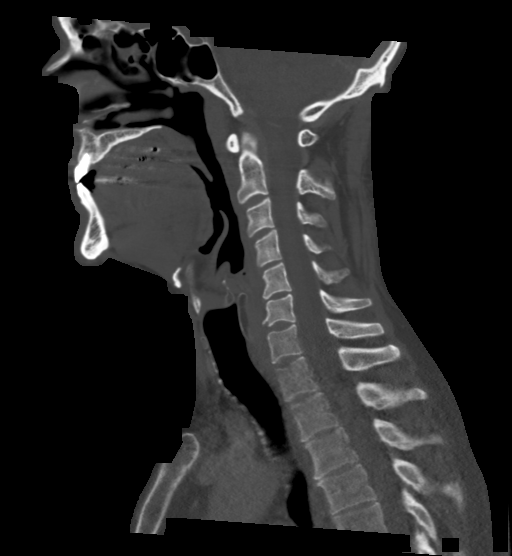
[im 53/91  bone]
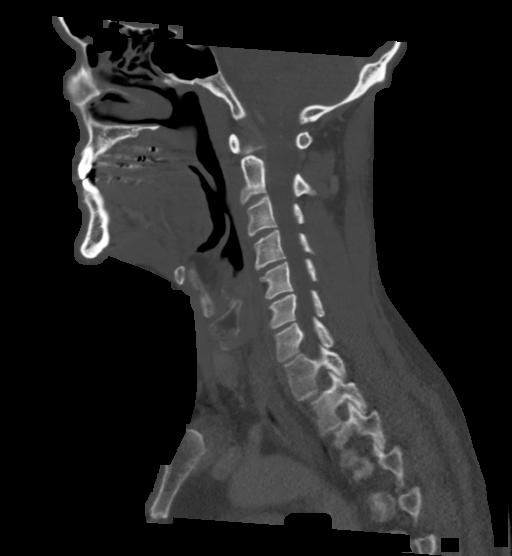
[im 61/91  bone]
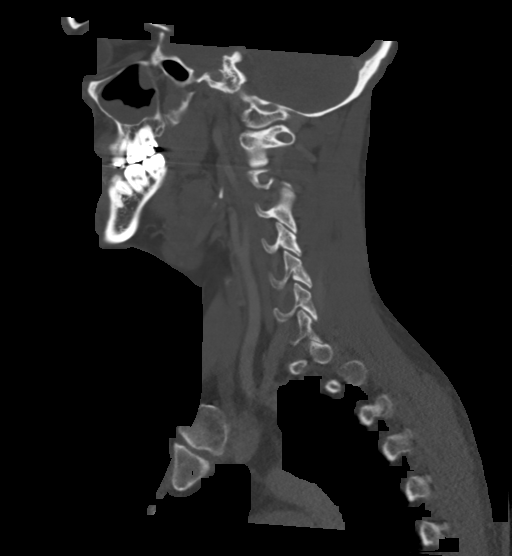

[Series 7: cor neck · coronal · 0.42mm/px · 3 of 132 slices shown]
[im 27/132  bone]
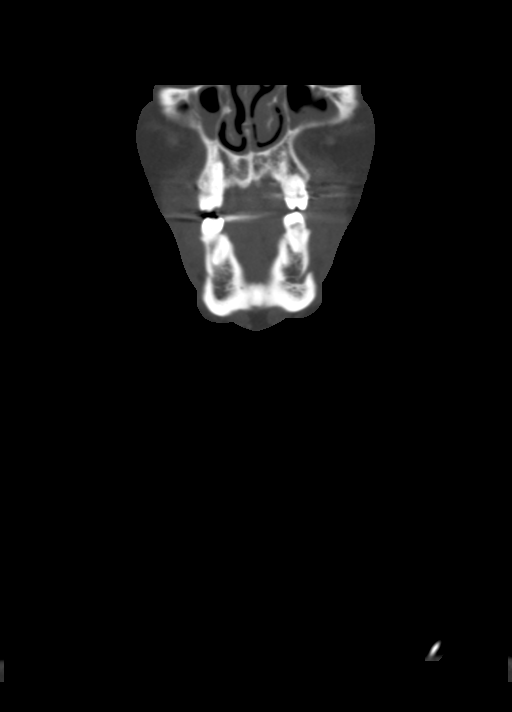
[im 53/132  bone]
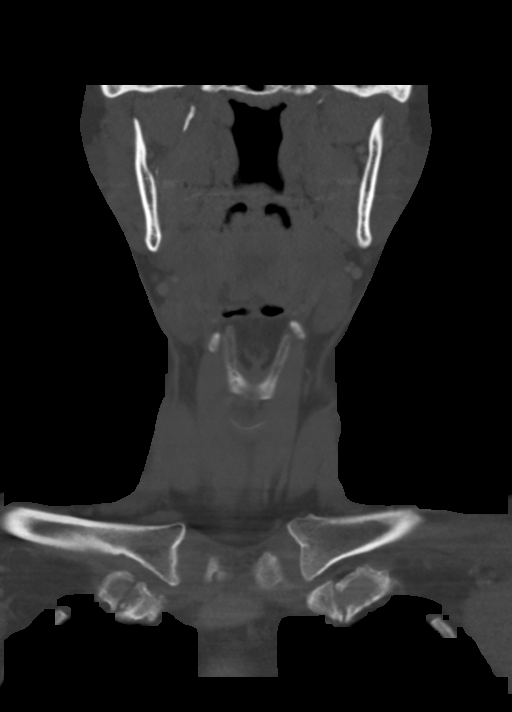
[im 79/132  bone]
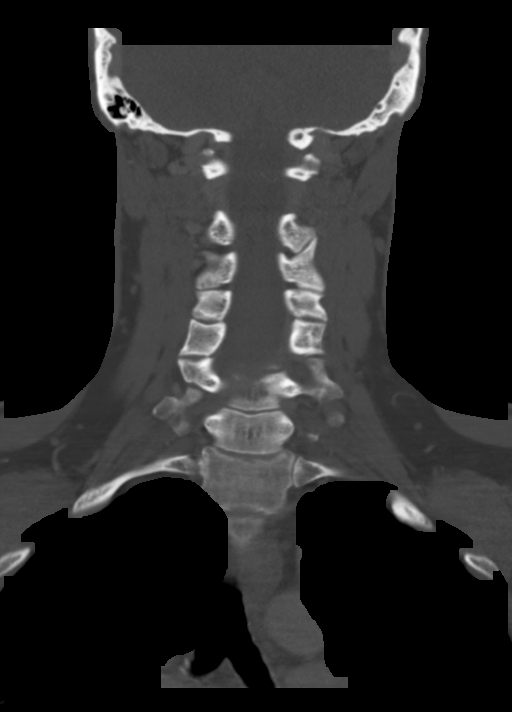

[Series 8: ax oropharynx · axial · 0.46mm/px · z∈[-295,-248]mm · 2 of 143 slices shown]
[im 24/143  bone]
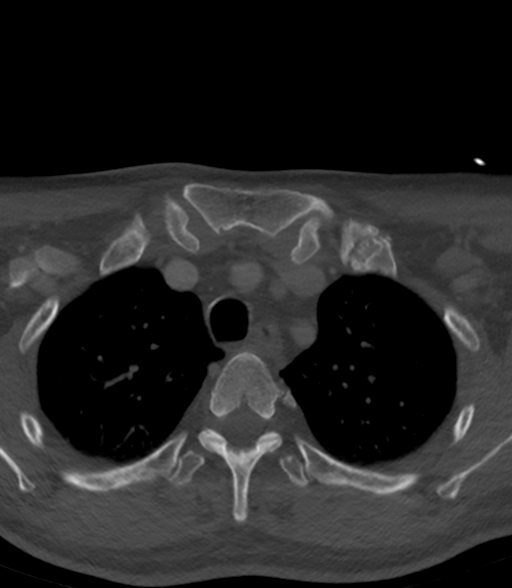
[im 48/143  bone]
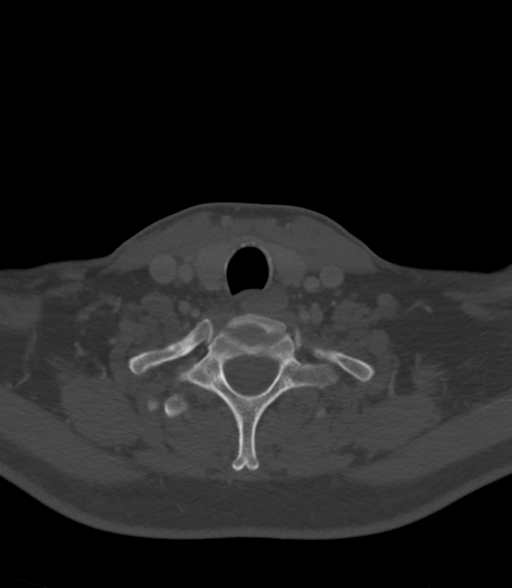

[15 of 33 positions shown; findings below may reference images not displayed]

FINDINGS: Pharynx and larynx: Unremarkable. No mass or swelling. Vocal cords
opposed at the time of imaging. Airway is patent.

Salivary glands: Parotid and submandibular glands are unremarkable.

Thyroid: Normal.

Lymph nodes: No enlarged or abnormal density nodes.

Vascular: Major neck vessels are patent.

Limited intracranial: No abnormal enhancement.

Visualized orbits: Unremarkable.

Mastoids and visualized paranasal sinuses: Paranasal sinus mucosal
thickening. Mastoid air cells are clear.

Skeleton: Minor degenerative changes at C5-C6.

Upper chest: No apical lung mass.

Other: None.
IMPRESSION: No acute or significant abnormality.
